# Patient Record
Sex: Male | Born: 1960 | Race: White | Hispanic: No | Smoking: Never smoker
Health system: Southern US, Community
[De-identification: ages and names within clinical notes are randomized; demographics above are authoritative.]

## PROBLEM LIST (undated history)

## (undated) DIAGNOSIS — I10 Essential (primary) hypertension: Secondary | ICD-10-CM

## (undated) HISTORY — DX: Essential (primary) hypertension: I10

---

## 1995-09-22 HISTORY — PX: FOOT FRACTURE SURGERY: SHX645

## 2004-08-25 ENCOUNTER — Ambulatory Visit: Payer: Self-pay | Admitting: Internal Medicine

## 2005-01-16 ENCOUNTER — Ambulatory Visit: Payer: Self-pay | Admitting: Internal Medicine

## 2005-11-02 ENCOUNTER — Ambulatory Visit: Payer: Self-pay | Admitting: Internal Medicine

## 2006-01-29 ENCOUNTER — Ambulatory Visit: Payer: Self-pay | Admitting: Internal Medicine

## 2006-03-13 ENCOUNTER — Emergency Department (HOSPITAL_COMMUNITY): Admission: EM | Admit: 2006-03-13 | Discharge: 2006-03-13 | Payer: Self-pay | Admitting: Family Medicine

## 2006-03-26 ENCOUNTER — Ambulatory Visit: Payer: Self-pay | Admitting: Family Medicine

## 2006-04-21 ENCOUNTER — Ambulatory Visit: Payer: Self-pay | Admitting: Internal Medicine

## 2006-05-07 ENCOUNTER — Ambulatory Visit: Payer: Self-pay | Admitting: Internal Medicine

## 2006-07-23 ENCOUNTER — Ambulatory Visit: Payer: Self-pay | Admitting: Internal Medicine

## 2006-07-28 ENCOUNTER — Encounter: Payer: Self-pay | Admitting: Internal Medicine

## 2006-07-28 ENCOUNTER — Encounter: Admission: RE | Admit: 2006-07-28 | Discharge: 2006-07-28 | Payer: Self-pay | Admitting: Internal Medicine

## 2006-09-23 ENCOUNTER — Encounter: Payer: Self-pay | Admitting: Internal Medicine

## 2006-12-20 ENCOUNTER — Encounter: Payer: Self-pay | Admitting: Internal Medicine

## 2006-12-20 ENCOUNTER — Ambulatory Visit: Payer: Self-pay | Admitting: Internal Medicine

## 2006-12-20 DIAGNOSIS — M109 Gout, unspecified: Secondary | ICD-10-CM

## 2006-12-20 DIAGNOSIS — M722 Plantar fascial fibromatosis: Secondary | ICD-10-CM | POA: Insufficient documentation

## 2006-12-20 DIAGNOSIS — I10 Essential (primary) hypertension: Secondary | ICD-10-CM

## 2007-05-19 IMAGING — CR DG FOOT COMPLETE 3+V*R*
2 series · 2 of 2 positions shown · non-contrast
Comparison: none

HISTORY: Pain, fall

RIGHT FOOT 3 VIEWS:
Mild soft tissue swelling medial to first metatarsal.
No fracture, dislocation, or bone destruction.
Mineralization normal and joint spaces preserved.

[view not recorded (1 of 2)]
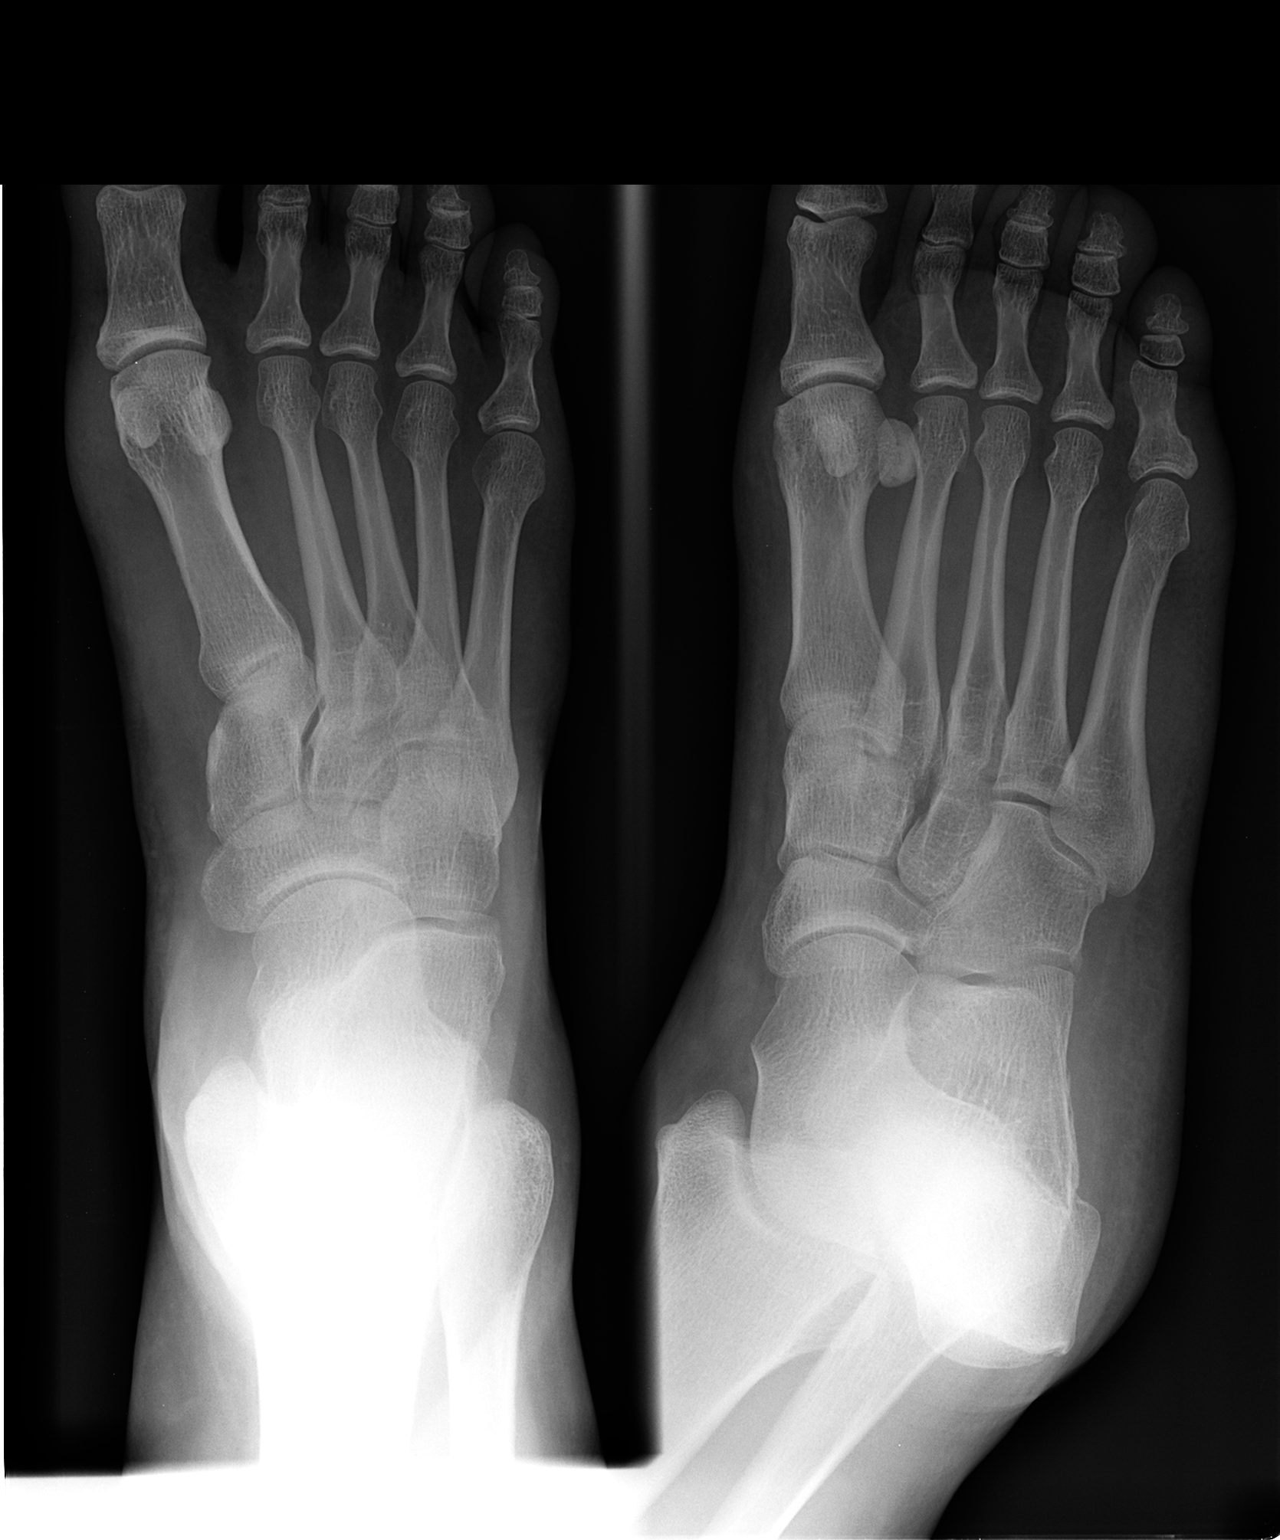

[view not recorded (2 of 2)]
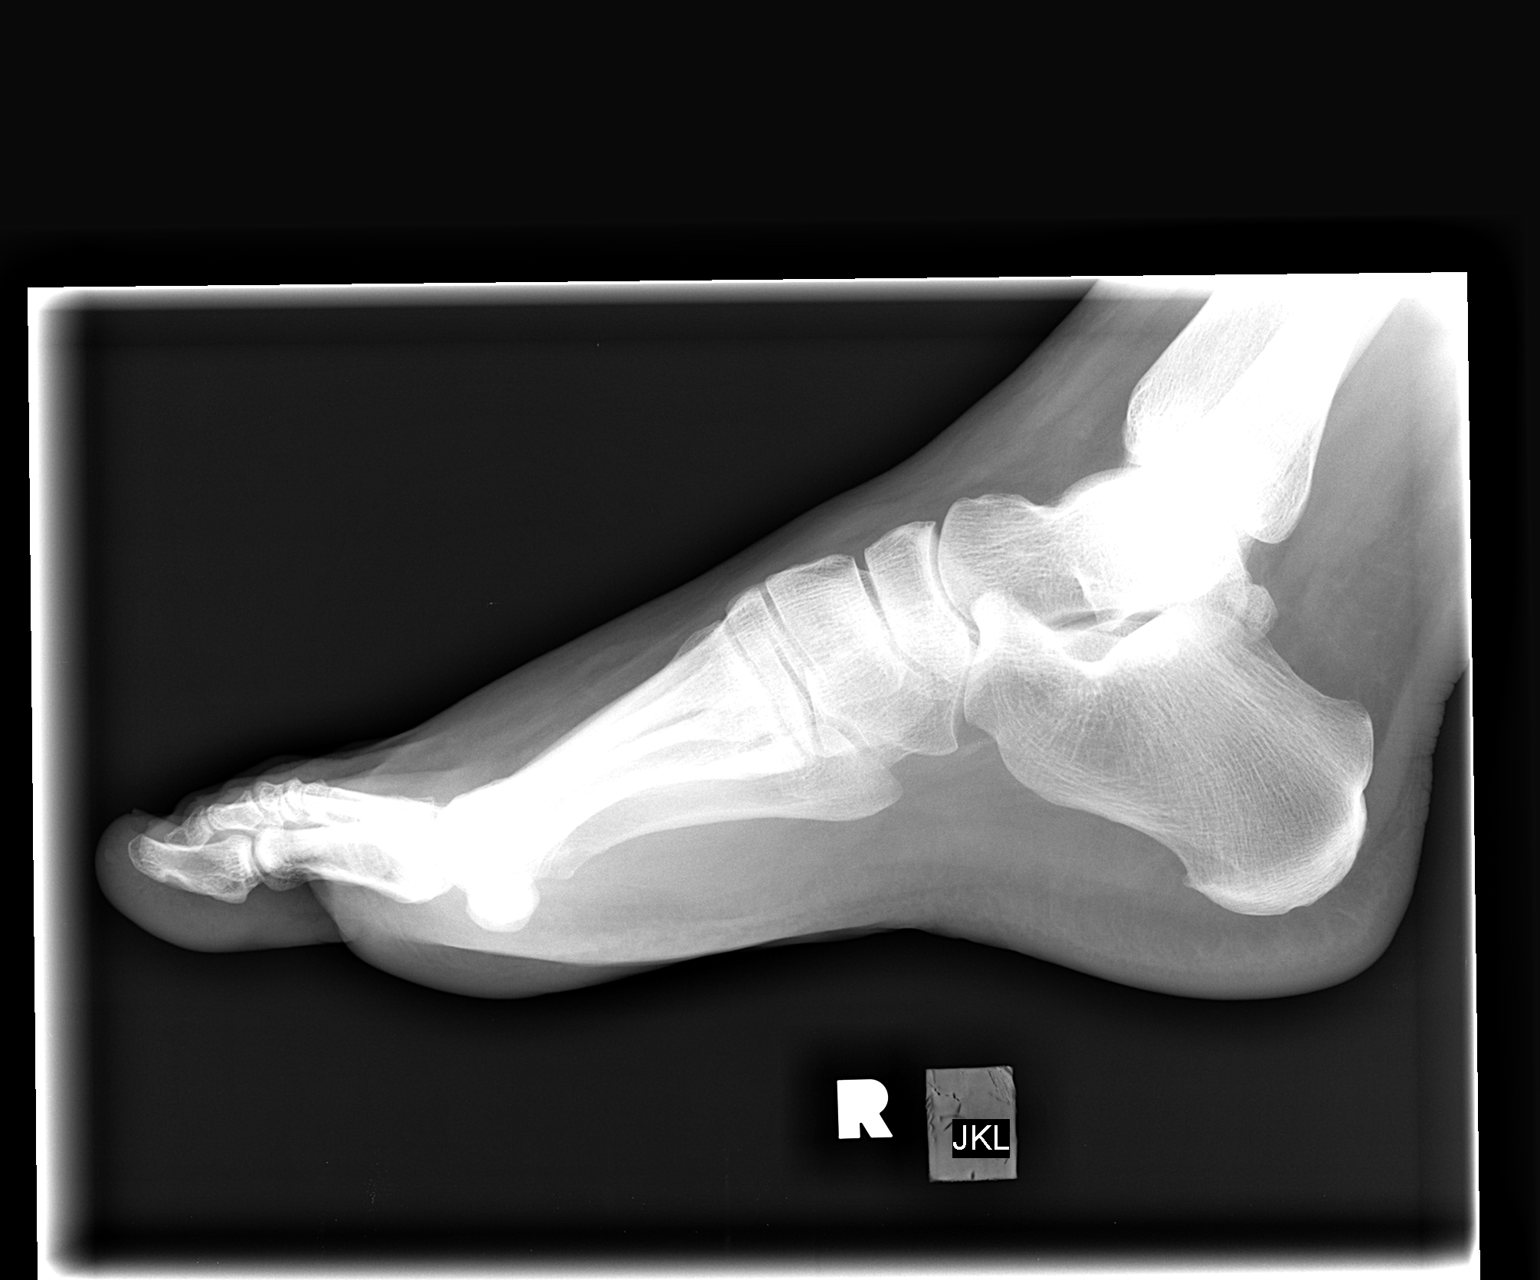

[2 of 2 positions shown; findings below may reference images not displayed]

IMPRESSION: No acute bony abnormalities.

## 2007-09-22 HISTORY — PX: ENDOVENOUS ABLATION SAPHENOUS VEIN W/ LASER: SUR449

## 2008-03-04 ENCOUNTER — Emergency Department (HOSPITAL_COMMUNITY): Admission: EM | Admit: 2008-03-04 | Discharge: 2008-03-04 | Payer: Self-pay | Admitting: Family Medicine

## 2008-04-27 ENCOUNTER — Ambulatory Visit: Payer: Self-pay | Admitting: Family Medicine

## 2008-04-27 DIAGNOSIS — L301 Dyshidrosis [pompholyx]: Secondary | ICD-10-CM

## 2008-04-27 LAB — CONVERTED CEMR LAB: KOH Prep: NEGATIVE

## 2009-02-20 ENCOUNTER — Ambulatory Visit: Payer: Self-pay | Admitting: Internal Medicine

## 2009-02-20 DIAGNOSIS — R42 Dizziness and giddiness: Secondary | ICD-10-CM

## 2009-02-20 DIAGNOSIS — R5381 Other malaise: Secondary | ICD-10-CM

## 2009-02-20 DIAGNOSIS — R5383 Other fatigue: Secondary | ICD-10-CM

## 2009-05-10 IMAGING — CR DG FINGER THUMB 2+V*R*
1 series · 1 of 1 positions shown · non-contrast
Comparison: None

CLINICAL DATA: Thumb trauma and pain.

RIGHT THUMB 2+V

[view not recorded]
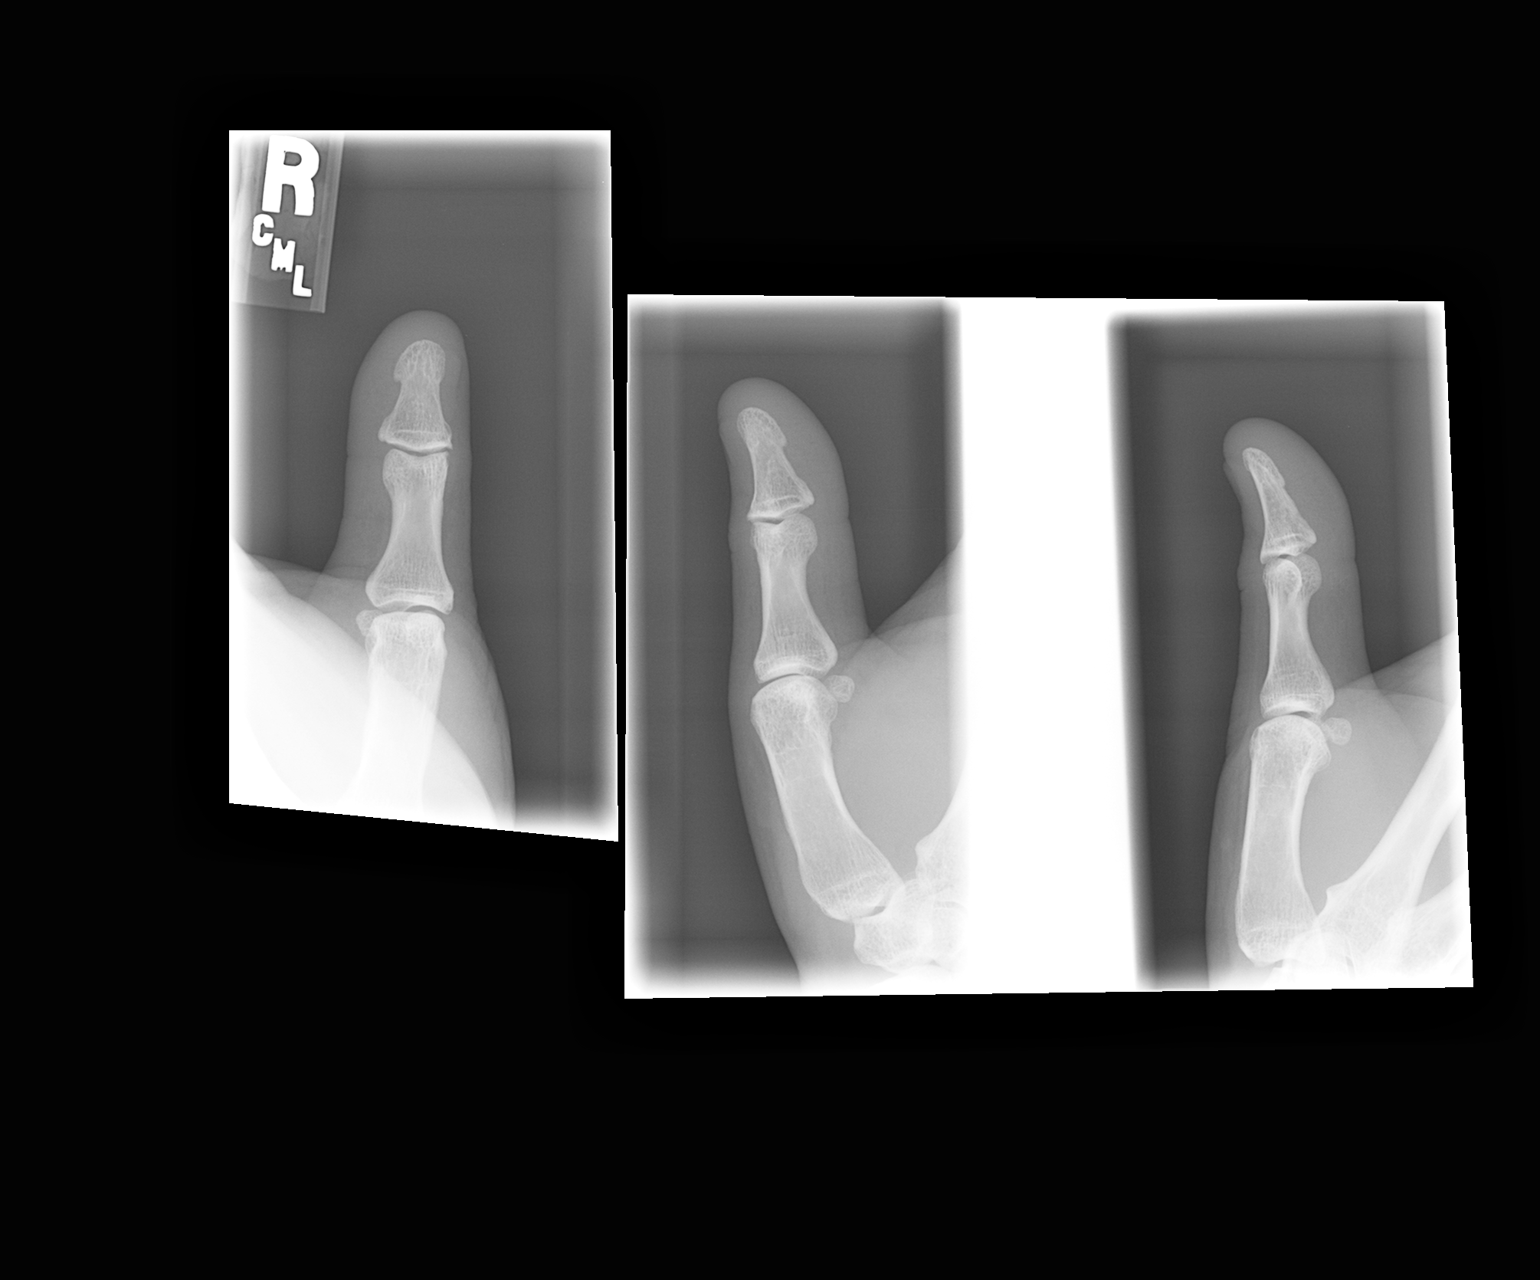

[1 of 1 positions shown; findings below may reference images not displayed]

There is no evidence of fracture or dislocation.  There is no
evidence of arthropathy or other focal bone abnormality.  Soft
tissues are unremarkable.
IMPRESSION: Negative.

## 2009-05-30 ENCOUNTER — Ambulatory Visit: Payer: Self-pay | Admitting: Internal Medicine

## 2009-05-31 LAB — CONVERTED CEMR LAB
Alkaline Phosphatase: 58 units/L (ref 39–117)
Basophils Absolute: 0 10*3/uL (ref 0.0–0.1)
Basophils Relative: 0.2 % (ref 0.0–3.0)
Chloride: 101 meq/L (ref 96–112)
Creatinine, Ser: 1 mg/dL (ref 0.4–1.5)
Eosinophils Absolute: 0.1 10*3/uL (ref 0.0–0.7)
Glucose, Bld: 102 mg/dL — ABNORMAL HIGH (ref 70–99)
HCT: 47.8 % (ref 39.0–52.0)
Hemoglobin: 16.8 g/dL (ref 13.0–17.0)
MCHC: 35.2 g/dL (ref 30.0–36.0)
MCV: 93.1 fL (ref 78.0–100.0)
Monocytes Absolute: 0.5 10*3/uL (ref 0.1–1.0)
Monocytes Relative: 7.7 % (ref 3.0–12.0)
Platelets: 169 10*3/uL (ref 150.0–400.0)
RBC: 5.13 M/uL (ref 4.22–5.81)
RDW: 12.4 % (ref 11.5–14.6)
TSH: 1.28 microintl units/mL (ref 0.35–5.50)
Total Bilirubin: 1 mg/dL (ref 0.3–1.2)
Total Protein: 6.9 g/dL (ref 6.0–8.3)

## 2009-07-23 ENCOUNTER — Ambulatory Visit: Payer: Self-pay | Admitting: Internal Medicine

## 2009-07-23 DIAGNOSIS — S01501A Unspecified open wound of lip, initial encounter: Secondary | ICD-10-CM | POA: Insufficient documentation

## 2010-10-19 LAB — CONVERTED CEMR LAB
AST: 33 units/L (ref 0–37)
BUN: 13 mg/dL (ref 6–23)
Basophils Absolute: 0 10*3/uL (ref 0.0–0.1)
Calcium: 9 mg/dL (ref 8.4–10.5)
Eosinophils Absolute: 0.2 10*3/uL (ref 0.0–0.7)
HCT: 48.2 % (ref 39.0–52.0)
Lymphocytes Relative: 27.2 % (ref 12.0–46.0)
MCV: 93.6 fL (ref 78.0–100.0)
Monocytes Absolute: 0.5 10*3/uL (ref 0.1–1.0)
Monocytes Relative: 8.1 % (ref 3.0–12.0)
Potassium: 4.2 meq/L (ref 3.5–5.1)
RBC: 5.15 M/uL (ref 4.22–5.81)
RDW: 12.4 % (ref 11.5–14.6)
Sed Rate: 7 mm/hr (ref 0–22)
Sodium: 142 meq/L (ref 135–145)
TSH: 2.11 microintl units/mL (ref 0.35–5.50)
Total Bilirubin: 0.9 mg/dL (ref 0.3–1.2)
WBC: 6.1 10*3/uL (ref 4.5–10.5)

## 2011-02-05 ENCOUNTER — Encounter: Payer: Self-pay | Admitting: Internal Medicine

## 2011-02-06 ENCOUNTER — Encounter: Payer: Self-pay | Admitting: Internal Medicine

## 2011-02-06 ENCOUNTER — Ambulatory Visit (INDEPENDENT_AMBULATORY_CARE_PROVIDER_SITE_OTHER): Payer: 59 | Admitting: Internal Medicine

## 2011-02-06 VITALS — BP 120/88 | HR 77 | Temp 98.4°F | Ht 68.0 in | Wt 223.0 lb

## 2011-02-06 DIAGNOSIS — Z1211 Encounter for screening for malignant neoplasm of colon: Secondary | ICD-10-CM

## 2011-02-06 DIAGNOSIS — I10 Essential (primary) hypertension: Secondary | ICD-10-CM

## 2011-02-06 DIAGNOSIS — Z0000 Encounter for general adult medical examination without abnormal findings: Secondary | ICD-10-CM

## 2011-02-06 DIAGNOSIS — M109 Gout, unspecified: Secondary | ICD-10-CM

## 2011-02-06 DIAGNOSIS — Z125 Encounter for screening for malignant neoplasm of prostate: Secondary | ICD-10-CM

## 2011-02-06 LAB — HEPATIC FUNCTION PANEL
ALT: 45 U/L (ref 0–53)
Albumin: 3.8 g/dL (ref 3.5–5.2)
Bilirubin, Direct: 0.1 mg/dL (ref 0.0–0.3)
Total Bilirubin: 0.5 mg/dL (ref 0.3–1.2)
Total Protein: 6.3 g/dL (ref 6.0–8.3)

## 2011-02-06 LAB — BASIC METABOLIC PANEL
BUN: 14 mg/dL (ref 6–23)
Calcium: 9 mg/dL (ref 8.4–10.5)
Chloride: 108 mEq/L (ref 96–112)

## 2011-02-06 LAB — PSA: PSA: 0.75 ng/mL (ref 0.10–4.00)

## 2011-02-06 LAB — CBC WITH DIFFERENTIAL/PLATELET
Basophils Absolute: 0 10*3/uL (ref 0.0–0.1)
Eosinophils Absolute: 0.2 10*3/uL (ref 0.0–0.7)
HCT: 46.4 % (ref 39.0–52.0)
MCV: 92.7 fl (ref 78.0–100.0)
Monocytes Absolute: 0.5 10*3/uL (ref 0.1–1.0)
RBC: 5 Mil/uL (ref 4.22–5.81)

## 2011-02-06 LAB — LIPID PANEL
Cholesterol: 167 mg/dL (ref 0–200)
HDL: 38.4 mg/dL — ABNORMAL LOW (ref >39.00)
Total CHOL/HDL Ratio: 4

## 2011-02-06 NOTE — Progress Notes (Signed)
Subjective:    Patient ID: Mitchell Hardy, male    DOB: 01-Aug-1961, 50 y.o.   MRN: [id number]  HPI Doing well No new problems Ran out of lisinopril and no refills so stopped med Feels fine occ edema in legs---discussed that this wouldn't help  No problems with the gout Eating much more healthy  Current outpatient prescriptions:DISCONTD: lisinopril (PRINIVIL,ZESTRIL) 2.5 MG tablet, Take 2.5 mg by mouth daily.  , Disp: , Rfl:   Past Medical History  Diagnosis Date  . Hypertension   . Gout     Past Surgical History  Procedure Date  . Foot fracture surgery 1997    has pins  . Endovenous ablation saphenous vein w/ laser 2009    right leg    Family History  Problem Relation Age of Onset  . COPD Mother     History   Social History  . Marital Status: Married    Spouse Name: N/A    Number of Children: 3  . Years of Education: N/A   Occupational History  . 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 Smoking status: Never Smoker   . Smokeless tobacco: Not on file  . Alcohol Use: Yes     occasional  . Drug Use: Not on file  . Sexually Active: Not on file   Other Topics Concern  . Not on file   Social History Narrative  . No narrative on file   Review of Systems  Constitutional: Negative for fatigue and unexpected weight change.  HENT: Positive for tinnitus. Negative for hearing loss, congestion, rhinorrhea and dental problem.        Chronic tinnitus since the service Keeps up with the dentist  Eyes: Negative for visual disturbance.       No focal vision loss or diplopia  Respiratory: Negative for cough, chest tightness and shortness of breath.   Cardiovascular: Positive for leg swelling. Negative for chest pain and palpitations.       Occ edema by the end of the day--ache at the end of the day Better since varicose vein procedure  Gastrointestinal: Negative for nausea, vomiting, abdominal pain, constipation and blood in stool.       Very rare  heartburn  Genitourinary: Negative for dysuria, urgency, frequency, decreased urine volume and difficulty urinating.       No sexual problems  Musculoskeletal: Positive for arthralgias. Negative for back pain and joint swelling.       Occ hand stiffness  Skin: Negative for rash.       No suspicious lesions  Neurological: Negative for dizziness, syncope, weakness, numbness and headaches.  Hematological: Negative for adenopathy. Does not bruise/bleed easily.  Psychiatric/Behavioral: Negative for hallucinations and sleep disturbance. The patient is not nervous/anxious.        Objective:   Physical Exam  Constitutional: He is oriented to person, place, and time. He appears well-developed and well-nourished. No distress.  HENT:  Head: Normocephalic and atraumatic.  Right Ear: External ear normal.  Left Ear: External ear normal.  Mouth/Throat: Oropharynx is clear and moist. No oropharyngeal exudate.       TMs normal  Eyes: Conjunctivae and EOM are normal. Pupils are equal, round, and reactive to light.       Fundi benign  Neck: Normal range of motion. Neck supple. No thyromegaly present.  Cardiovascular: Normal rate, regular rhythm, normal heart sounds and intact distal pulses.  Exam reveals no gallop.   No murmur heard. Pulmonary/Chest: Effort normal  and breath sounds normal. No respiratory distress. He has no wheezes. He has no rales.  Abdominal: Soft. He exhibits no mass. There is no tenderness.  Musculoskeletal: Normal range of motion. He exhibits no edema and no tenderness.  Lymphadenopathy:    He has no cervical adenopathy.  Neurological: He is alert and oriented to person, place, and time. He exhibits normal muscle tone.  Skin: Skin is warm. No rash noted.       No suspicious lesions  Psychiatric: He has a normal mood and affect. His behavior is normal. Judgment and thought content normal.          Assessment & Plan:

## 2011-03-26 ENCOUNTER — Ambulatory Visit (AMBULATORY_SURGERY_CENTER): Payer: 59 | Admitting: *Deleted

## 2011-03-26 VITALS — Ht 68.0 in | Wt 223.0 lb

## 2011-03-26 DIAGNOSIS — Z1211 Encounter for screening for malignant neoplasm of colon: Secondary | ICD-10-CM

## 2011-03-26 MED ORDER — PEG-KCL-NACL-NASULF-NA ASC-C 100 G PO SOLR: ORAL | Status: DC

## 2011-04-09 ENCOUNTER — Ambulatory Visit (AMBULATORY_SURGERY_CENTER): Payer: 59 | Admitting: Internal Medicine

## 2011-04-09 ENCOUNTER — Encounter: Payer: Self-pay | Admitting: Internal Medicine

## 2011-04-09 VITALS — BP 136/99 | HR 69 | Temp 96.9°F | Resp 18 | Ht 68.0 in | Wt 218.0 lb

## 2011-04-09 DIAGNOSIS — D126 Benign neoplasm of colon, unspecified: Secondary | ICD-10-CM

## 2011-04-09 DIAGNOSIS — D128 Benign neoplasm of rectum: Secondary | ICD-10-CM

## 2011-04-09 DIAGNOSIS — D129 Benign neoplasm of anus and anal canal: Secondary | ICD-10-CM

## 2011-04-09 DIAGNOSIS — Z1211 Encounter for screening for malignant neoplasm of colon: Secondary | ICD-10-CM

## 2011-04-09 MED ORDER — SODIUM CHLORIDE 0.9 % IV SOLN: 500.0000 mL | INTRAVENOUS | Status: DC

## 2011-04-09 NOTE — Patient Instructions (Signed)
Please read the handouts given to you by your recovery room nurse.   We will send you a letter within 2 weeks regarding the results of your biopsies.   Please increase the fiber in your diet due to your diverticulosis.     Resume your routine medications today.   If you have any questions or concerns, please call us at [phone].  Thank-you.

## 2011-04-10 ENCOUNTER — Telehealth: Payer: Self-pay | Admitting: *Deleted

## 2011-04-10 NOTE — Telephone Encounter (Signed)

## 2011-04-14 ENCOUNTER — Encounter: Payer: Self-pay | Admitting: Internal Medicine

## 2011-04-20 ENCOUNTER — Telehealth: Payer: Self-pay | Admitting: *Deleted

## 2011-04-20 NOTE — Telephone Encounter (Signed)
Pt has recently had a colonoscopy and is asking that you call him to discuss the results.  I advised him that you are out until 8/6.

## 2011-04-27 NOTE — Telephone Encounter (Signed)
Had 2 small polyps (tubular adenoma) and very early diverticulosis No answer on his phones---will try back

## 2011-04-28 NOTE — Telephone Encounter (Signed)
Message left again.

## 2011-04-29 NOTE — Telephone Encounter (Signed)
Discussed results Repeat colonoscopy recommended in 5 years No changes needed with the tiny diverticuli

## 2012-02-09 ENCOUNTER — Encounter: Payer: Self-pay | Admitting: Internal Medicine

## 2012-02-09 ENCOUNTER — Ambulatory Visit (INDEPENDENT_AMBULATORY_CARE_PROVIDER_SITE_OTHER): Payer: 59 | Admitting: Internal Medicine

## 2012-02-09 VITALS — BP 122/88 | HR 81 | Temp 97.7°F | Ht 68.0 in | Wt 219.0 lb

## 2012-02-09 DIAGNOSIS — G479 Sleep disorder, unspecified: Secondary | ICD-10-CM

## 2012-02-09 DIAGNOSIS — G478 Other sleep disorders: Secondary | ICD-10-CM

## 2012-02-09 DIAGNOSIS — I10 Essential (primary) hypertension: Secondary | ICD-10-CM

## 2012-02-09 DIAGNOSIS — Z0000 Encounter for general adult medical examination without abnormal findings: Secondary | ICD-10-CM

## 2012-02-09 LAB — PSA: PSA: 0.8 ng/mL (ref 0.10–4.00)

## 2012-02-09 NOTE — Assessment & Plan Note (Signed)
Healthy but still could work more on fitness Will check sugar and PSA

## 2012-02-09 NOTE — Progress Notes (Signed)
Subjective:    Patient ID: Mitchell Hardy, male    DOB: 1961-02-08, 51 y.o.   MRN: [id number]  HPI Doing well Business is good  Has had some arguments with wife---thinks she may be in "mid-life crisis" Disagreements about how she treats her oldest--a daughter back from college but now at [facility] and living with them Discussed pursuing counseling with her  Had hemorrhoid about 3 weeks ago Used sitz baths and tuck pads Settled down  Gout is quiet  No current outpatient prescriptions on file prior to visit.   Current Facility-Administered Medications on File Prior to Visit  Medication Dose Route Frequency Provider Last Rate Last Dose  . DISCONTD: 0.9 %  sodium chloride infusion  500 mL Intravenous Continuous Hart Carwin, MD        Allergies  Allergen Reactions  . Atarax (Hydroxyzine Hcl) Anaphylaxis and Rash    Past Medical History  Diagnosis Date  . Hypertension   . Gout     Past Surgical History  Procedure Date  . Foot fracture surgery 1997    has pins  . Endovenous ablation saphenous vein w/ laser 2009    right leg    Family History  Problem Relation Age of Onset  . COPD Mother     History   Social History  . Marital Status: Married    Spouse Name: N/A    Number of Children: 3  . Years of Education: N/A   Occupational History  . 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 Smoking status: Never Smoker   . Smokeless tobacco: Never Used  . Alcohol Use: Yes     occasional  . Drug Use: No  . Sexually Active: Not on file   Other Topics Concern  . Not on file   Social History Narrative  . No narrative on file   Review of Systems  Constitutional: Negative for fatigue and unexpected weight change.       Wears seat belt  HENT: Negative for hearing loss, congestion, rhinorrhea, dental problem and tinnitus.        Regular with dentist  Eyes: Negative for visual disturbance.       No diplopia or unilateral vision loss Some astigmatism in right  eye---uses readers  Respiratory: Negative for cough, chest tightness and shortness of breath.   Cardiovascular: Positive for leg swelling. Negative for chest pain and palpitations.       Mild ankle edema at the end of the day  Gastrointestinal: Negative for nausea, vomiting, abdominal pain, constipation and blood in stool.       No heartburn  Genitourinary: Negative for frequency and difficulty urinating.       No sexual problems  Musculoskeletal: Negative for back pain, joint swelling and arthralgias.  Skin: Negative for color change and rash.  Neurological: Positive for numbness. Negative for dizziness, syncope, weakness, light-headedness and headaches.  Hematological: Negative for adenopathy. Does not bruise/bleed easily.  Psychiatric/Behavioral: Positive for sleep disturbance. Negative for dysphoric mood. The patient is not nervous/anxious.        Not a great sleeper---unusual work schedule Wife wonders about sleep apnea----feels he has poor sleep quality       Objective:   Physical Exam  Constitutional: He is oriented to person, place, and time. He appears well-developed and well-nourished. No distress.  HENT:  Head: Normocephalic and atraumatic.  Right Ear: External ear normal.  Left Ear: External ear normal.  Mouth/Throat: Oropharynx is clear and moist.  No oropharyngeal exudate.  Eyes: Conjunctivae and EOM are normal. Pupils are equal, round, and reactive to light.  Neck: Normal range of motion. Neck supple. No thyromegaly present.  Cardiovascular: Normal rate, regular rhythm, normal heart sounds and intact distal pulses.  Exam reveals no gallop.   No murmur heard. Pulmonary/Chest: Effort normal and breath sounds normal. No respiratory distress. He has no wheezes. He has no rales.  Abdominal: Soft. There is no tenderness.  Musculoskeletal: Normal range of motion. He exhibits no edema and no tenderness.  Lymphadenopathy:    He has no cervical adenopathy.  Neurological: He is  alert and oriented to person, place, and time.  Skin: No rash noted. No erythema.  Psychiatric: He has a normal mood and affect. His behavior is normal. Thought content normal.          Assessment & Plan:

## 2012-02-09 NOTE — Assessment & Plan Note (Signed)
Non restorative sleep and daytime sleepiness Will set up evaluation

## 2012-02-09 NOTE — Assessment & Plan Note (Signed)
BP Readings from Last 3 Encounters:  02/09/12 122/88  04/09/11 136/99  02/06/11 120/88   Has good control without meds

## 2012-02-11 ENCOUNTER — Encounter: Payer: Self-pay | Admitting: *Deleted

## 2012-03-04 ENCOUNTER — Institutional Professional Consult (permissible substitution): Payer: 59 | Admitting: Pulmonary Disease

## 2012-03-11 ENCOUNTER — Institutional Professional Consult (permissible substitution): Payer: 59 | Admitting: Pulmonary Disease

## 2013-02-15 ENCOUNTER — Encounter: Payer: 59 | Admitting: Internal Medicine

## 2013-02-15 DIAGNOSIS — Z0289 Encounter for other administrative examinations: Secondary | ICD-10-CM

## 2016-02-24 ENCOUNTER — Encounter: Payer: Self-pay | Admitting: Gastroenterology

## 2018-10-26 ENCOUNTER — Ambulatory Visit: Payer: BLUE CROSS/BLUE SHIELD | Admitting: Internal Medicine

## 2018-10-26 ENCOUNTER — Encounter: Payer: Self-pay | Admitting: Internal Medicine

## 2018-10-26 ENCOUNTER — Encounter (INDEPENDENT_AMBULATORY_CARE_PROVIDER_SITE_OTHER): Payer: Self-pay

## 2018-10-26 VITALS — BP 130/88 | HR 82 | Temp 98.2°F | Ht 67.0 in | Wt 254.0 lb

## 2018-10-26 DIAGNOSIS — D126 Benign neoplasm of colon, unspecified: Secondary | ICD-10-CM | POA: Diagnosis not present

## 2018-10-26 DIAGNOSIS — Z0000 Encounter for general adult medical examination without abnormal findings: Secondary | ICD-10-CM

## 2018-10-26 DIAGNOSIS — Z0001 Encounter for general adult medical examination with abnormal findings: Secondary | ICD-10-CM

## 2018-10-26 DIAGNOSIS — M1A9XX0 Chronic gout, unspecified, without tophus (tophi): Secondary | ICD-10-CM

## 2018-10-26 DIAGNOSIS — I10 Essential (primary) hypertension: Secondary | ICD-10-CM | POA: Diagnosis not present

## 2018-10-26 DIAGNOSIS — L570 Actinic keratosis: Secondary | ICD-10-CM

## 2018-10-26 LAB — COMPREHENSIVE METABOLIC PANEL
ALBUMIN: 4 g/dL (ref 3.5–5.2)
ALK PHOS: 57 U/L (ref 39–117)
ALT: 49 U/L (ref 0–53)
AST: 41 U/L — ABNORMAL HIGH (ref 0–37)
BILIRUBIN TOTAL: 0.4 mg/dL (ref 0.2–1.2)
BUN: 13 mg/dL (ref 6–23)
CALCIUM: 8.5 mg/dL (ref 8.4–10.5)
CO2: 26 mEq/L (ref 19–32)
CREATININE: 1.05 mg/dL (ref 0.40–1.50)
Chloride: 103 mEq/L (ref 96–112)
GFR: 72.45 mL/min (ref >60.00)
Glucose, Bld: 87 mg/dL (ref 70–99)
Potassium: 3.8 mEq/L (ref 3.5–5.1)
Sodium: 138 mEq/L (ref 135–145)
TOTAL PROTEIN: 6.8 g/dL (ref 6.0–8.3)

## 2018-10-26 LAB — CBC
HEMATOCRIT: 49.3 % (ref 39.0–52.0)
Hemoglobin: 17.1 g/dL — ABNORMAL HIGH (ref 13.0–17.0)
MCHC: 34.7 g/dL (ref 30.0–36.0)
MCV: 89.6 fl (ref 78.0–100.0)
PLATELETS: 155 10*3/uL (ref 150.0–400.0)
RBC: 5.5 Mil/uL (ref 4.22–5.81)
RDW: 13.7 % (ref 11.5–15.5)
WBC: 3.7 10*3/uL — AB (ref 4.0–10.5)

## 2018-10-26 LAB — LIPID PANEL
CHOLESTEROL: 117 mg/dL (ref 0–200)
HDL: 20.5 mg/dL — ABNORMAL LOW (ref >39.00)
LDL Cholesterol: 71 mg/dL (ref 0–99)
NonHDL: 96.49
TRIGLYCERIDES: 127 mg/dL (ref 0.0–149.0)
Total CHOL/HDL Ratio: 6
VLDL: 25.4 mg/dL (ref 0.0–40.0)

## 2018-10-26 LAB — URIC ACID: Uric Acid, Serum: 8 mg/dL — ABNORMAL HIGH (ref 4.0–7.8)

## 2018-10-26 LAB — T4, FREE: Free T4: 1.06 ng/dL (ref 0.60–1.60)

## 2018-10-26 MED ORDER — TRIAMTERENE-HCTZ 37.5-25 MG PO TABS: 1.0000 | ORAL_TABLET | Freq: Every day | ORAL | 3 refills | Status: DC

## 2018-10-26 NOTE — Assessment & Plan Note (Signed)
Healthy but has let himself go Overdue for colonoscopy Discussed PSA--will check Discussed DASH and exercise

## 2018-10-26 NOTE — Assessment & Plan Note (Signed)
BP Readings from Last 3 Encounters:  10/26/18 130/88  02/09/12 122/88  04/09/11 (!) 136/99   As high as 160/110 at home And fluid retention Will start HCTZ

## 2018-10-26 NOTE — Progress Notes (Signed)
Subjective:    Patient ID: Mitchell Hardy, male    DOB: 1961/02/03, 58 y.o.   MRN: [id number]  HPI Here to reestablish Moved to [place] briefly --but back here for some years Hasn't seen any doctors  Has a spot on left temple Present for 6 months Tried topical antibiotics --heals with crusting, then comes back  Has been off BP meds It is creeping back up Notes his weight creeping up Fluid problems with his feet ---swelling in the evening Less active at work [employer] he eats fairly healthy  Past gout---knows now it is related to hard cheeses  No current outpatient medications on file prior to visit.   No current facility-administered medications on file prior to visit.     Allergies  Allergen Reactions  . Atarax [Hydroxyzine Hcl] Anaphylaxis and Rash    Past Medical History:  Diagnosis Date  . Gout   . Hypertension     Past Surgical History:  Procedure Laterality Date  . ENDOVENOUS ABLATION SAPHENOUS VEIN W/ LASER  2009   right leg  . [place]   has pins    Family History  Problem Relation Age of Onset  . COPD Mother   . Head & neck cancer Father     Social History   Socioeconomic History  . Marital status: Divorced    Spouse name: Not on file  . Number of children: 3  . Years of education: Not on file  . Highest education level: Not on file  Occupational History  . Occupation: [employer]     Comment: [employer]  Social Needs  . Financial resource strain: Not on file  . Food insecurity:    Worry: Not on file    Inability: Not on file  . Transportation needs:    Medical: Not on file    Non-medical: Not on file  Tobacco Use  . Smoking status: Never Smoker  . Smokeless tobacco: Never Used  Substance and Sexual Activity  . Alcohol use: Yes    Comment: occasional  . Drug use: No  . Sexual activity: Not on file  Lifestyle  . Physical activity:    Days per week: Not on file    Minutes per session: Not on file  .  Stress: Not on file  Relationships  . Social connections:    Talks on phone: Not on file    Gets together: Not on file    Attends religious service: Not on file    Active member of club or organization: Not on file    Attends meetings of clubs or organizations: Not on file    Relationship status: Not on file  . Intimate partner violence:    Fear of current or ex partner: Not on file    Emotionally abused: Not on file    Physically abused: Not on file    Forced sexual activity: Not on file  Other Topics Concern  . Not on file  Social History Narrative   Divorced ~2010   Step son and daughter and he has child from earlier relationship   Now living with woman since ~2017   She has grown children also   Review of Systems  Constitutional: Negative for fatigue.       Has gained 35# since last visit Wears seat belt  HENT: Positive for tinnitus. Negative for dental problem and hearing loss.        Keeps up with dentist  Eyes: Negative for  visual disturbance.       No diplopia or unilateral vision loss  Respiratory:       Cough with recent URI DOE as expected  Cardiovascular: Positive for leg swelling. Negative for chest pain and palpitations.  Gastrointestinal: Negative for blood in stool and constipation.       No heartburn  Endocrine: Negative for polydipsia and polyuria.  Genitourinary: Negative for difficulty urinating and urgency.       Nocturia x 1-2 No sexual problems  Musculoskeletal: Negative for arthralgias, back pain and joint swelling.  Skin: Negative for rash.  Allergic/Immunologic: Positive for environmental allergies. Negative for immunocompromised state.       No meds   Neurological: Negative for dizziness, syncope, light-headedness and headaches.  Hematological: Negative for adenopathy. Does not bruise/bleed easily.  Psychiatric/Behavioral: Negative for dysphoric mood. The patient is not nervous/anxious.        Chronic sleep problems--- discussed trying  melatonin       Objective:   Physical Exam  Constitutional: He is oriented to person, place, and time. He appears well-developed. No distress.  HENT:  Head: Normocephalic and atraumatic.  Right Ear: External ear normal.  Left Ear: External ear normal.  Mouth/Throat: Oropharynx is clear and moist. No oropharyngeal exudate.  Eyes: Pupils are equal, round, and reactive to light. Conjunctivae are normal.  Neck: No thyromegaly present.  Cardiovascular: Normal rate, regular rhythm, normal heart sounds and intact distal pulses. Exam reveals no gallop.  No murmur heard. Respiratory: Effort normal and breath sounds normal. No respiratory distress. He has no wheezes. He has no rales.  GI: Soft. There is no abdominal tenderness.  Musculoskeletal:     Comments: Calves/feet full without pitting  Lymphadenopathy:    He has no cervical adenopathy.  Neurological: He is alert and oriented to person, place, and time.  Skin:  23mm actinic left temple  Psychiatric: He has a normal mood and affect. His behavior is normal.           Assessment & Plan:

## 2018-10-26 NOTE — Assessment & Plan Note (Signed)
Brought on by hard cheeses Will have to stop HCTZ if flares occur

## 2018-10-26 NOTE — Assessment & Plan Note (Signed)
Discussed options Verbal consent Liquid nitrogen cryotherapy 30 seconds x 2 Tolerated well Discussed home care Derm if recurs

## 2018-10-26 NOTE — Patient Instructions (Signed)
DASH Eating Plan  DASH stands for "Dietary Approaches to Stop Hypertension." The DASH eating plan is a healthy eating plan that has been shown to reduce high blood pressure (hypertension). It may also reduce your risk for type 2 diabetes, heart disease, and stroke. The DASH eating plan may also help with weight loss.  What are tips for following this plan?    General guidelines   Avoid eating more than 2,300 mg (milligrams) of salt (sodium) a day. If you have hypertension, you may need to reduce your sodium intake to 1,500 mg a day.   Limit alcohol intake to no more than 1 drink a day for nonpregnant women and 2 drinks a day for men. One drink equals 12 oz of beer, 5 oz of wine, or 1 oz of hard liquor.   Work with your health care provider to maintain a healthy body weight or to lose weight. Ask what an ideal weight is for you.   Get at least 30 minutes of exercise that causes your heart to beat faster (aerobic exercise) most days of the week. Activities may include walking, swimming, or biking.   Work with your health care provider or diet and nutrition specialist (dietitian) to adjust your eating plan to your individual calorie needs.  Reading food labels     Check food labels for the amount of sodium per serving. Choose foods with less than 5 percent of the Daily Value of sodium. Generally, foods with less than 300 mg of sodium per serving fit into this eating plan.   To find whole grains, look for the word "whole" as the first word in the ingredient list.  Shopping   Buy products labeled as "low-sodium" or "no salt added."   Buy fresh foods. Avoid canned foods and premade or frozen meals.  Cooking   Avoid adding salt when cooking. Use salt-free seasonings or herbs instead of table salt or sea salt. Check with your health care provider or pharmacist before using salt substitutes.   Do not fry foods. Cook foods using healthy methods such as baking, boiling, grilling, and broiling instead.   Cook with  heart-healthy oils, such as olive, canola, soybean, or sunflower oil.  Meal planning   Eat a balanced diet that includes:  ? 5 or more servings of fruits and vegetables each day. At each meal, try to fill half of your plate with fruits and vegetables.  ? Up to 6-8 servings of whole grains each day.  ? Less than 6 oz of lean meat, poultry, or fish each day. A 3-oz serving of meat is about the same size as a deck of cards. One egg equals 1 oz.  ? 2 servings of low-fat dairy each day.  ? A serving of nuts, seeds, or beans 5 times each week.  ? Heart-healthy fats. Healthy fats called Omega-3 fatty acids are found in foods such as flaxseeds and coldwater fish, like sardines, salmon, and mackerel.   Limit how much you eat of the following:  ? Canned or prepackaged foods.  ? Food that is high in trans fat, such as fried foods.  ? Food that is high in saturated fat, such as fatty meat.  ? Sweets, desserts, sugary drinks, and other foods with added sugar.  ? Full-fat dairy products.   Do not salt foods before eating.   Try to eat at least 2 vegetarian meals each week.   Eat more home-cooked food and less restaurant, buffet, and fast food.     When eating at a restaurant, ask that your food be prepared with less salt or no salt, if possible.  What foods are recommended?  The items listed may not be a complete list. Talk with your dietitian about what dietary choices are best for you.  Grains  Whole-grain or whole-wheat bread. Whole-grain or whole-wheat pasta. Brown rice. Oatmeal. Quinoa. Bulgur. Whole-grain and low-sodium cereals. Pita bread. Low-fat, low-sodium crackers. Whole-wheat flour tortillas.  Vegetables  Fresh or frozen vegetables (raw, steamed, roasted, or grilled). Low-sodium or reduced-sodium tomato and vegetable juice. Low-sodium or reduced-sodium tomato sauce and tomato paste. Low-sodium or reduced-sodium canned vegetables.  Fruits  All fresh, dried, or frozen fruit. Canned fruit in natural juice (without  added sugar).  Meat and other protein foods  Skinless chicken or turkey. Ground chicken or turkey. Pork with fat trimmed off. Fish and seafood. Egg whites. Dried beans, peas, or lentils. Unsalted nuts, nut butters, and seeds. Unsalted canned beans. Lean cuts of beef with fat trimmed off. Low-sodium, lean deli meat.  Dairy  Low-fat (1%) or fat-free (skim) milk. Fat-free, low-fat, or reduced-fat cheeses. Nonfat, low-sodium ricotta or cottage cheese. Low-fat or nonfat yogurt. Low-fat, low-sodium cheese.  Fats and oils  Soft margarine without trans fats. Vegetable oil. Low-fat, reduced-fat, or light mayonnaise and salad dressings (reduced-sodium). Canola, safflower, olive, soybean, and sunflower oils. Avocado.  Seasoning and other foods  Herbs. Spices. Seasoning mixes without salt. Unsalted popcorn and pretzels. Fat-free sweets.  What foods are not recommended?  The items listed may not be a complete list. Talk with your dietitian about what dietary choices are best for you.  Grains  Baked goods made with fat, such as croissants, muffins, or some breads. Dry pasta or rice meal packs.  Vegetables  Creamed or fried vegetables. Vegetables in a cheese sauce. Regular canned vegetables (not low-sodium or reduced-sodium). Regular canned tomato sauce and paste (not low-sodium or reduced-sodium). Regular tomato and vegetable juice (not low-sodium or reduced-sodium). Pickles. Olives.  Fruits  Canned fruit in a light or heavy syrup. Fried fruit. Fruit in cream or butter sauce.  Meat and other protein foods  Fatty cuts of meat. Ribs. Fried meat. Bacon. Sausage. Bologna and other processed lunch meats. Salami. Fatback. Hotdogs. Bratwurst. Salted nuts and seeds. Canned beans with added salt. Canned or smoked fish. Whole eggs or egg yolks. Chicken or turkey with skin.  Dairy  Whole or 2% milk, cream, and half-and-half. Whole or full-fat cream cheese. Whole-fat or sweetened yogurt. Full-fat cheese. Nondairy creamers. Whipped toppings.  Processed cheese and cheese spreads.  Fats and oils  Butter. Stick margarine. Lard. Shortening. Ghee. Bacon fat. Tropical oils, such as coconut, palm kernel, or palm oil.  Seasoning and other foods  Salted popcorn and pretzels. Onion salt, garlic salt, seasoned salt, table salt, and sea salt. Worcestershire sauce. Tartar sauce. Barbecue sauce. Teriyaki sauce. Soy sauce, including reduced-sodium. Steak sauce. Canned and packaged gravies. Fish sauce. Oyster sauce. Cocktail sauce. Horseradish that you find on the shelf. Ketchup. Mustard. Meat flavorings and tenderizers. Bouillon cubes. Hot sauce and Tabasco sauce. Premade or packaged marinades. Premade or packaged taco seasonings. Relishes. Regular salad dressings.  Where to find more information:   National Heart, Lung, and Blood Institute: www.nhlbi.nih.gov   American Heart Association: www.heart.org  Summary   The DASH eating plan is a healthy eating plan that has been shown to reduce high blood pressure (hypertension). It may also reduce your risk for type 2 diabetes, heart disease, and stroke.   With the   DASH eating plan, you should limit salt (sodium) intake to 2,300 mg a day. If you have hypertension, you may need to reduce your sodium intake to 1,500 mg a day.   When on the DASH eating plan, aim to eat more fresh fruits and vegetables, whole grains, lean proteins, low-fat dairy, and heart-healthy fats.   Work with your health care provider or diet and nutrition specialist (dietitian) to adjust your eating plan to your individual calorie needs.  This information is not intended to replace advice given to you by your health care provider. Make sure you discuss any questions you have with your health care provider.  Document Released: 08/27/2011 Document Revised: 08/31/2016 Document Reviewed: 08/31/2016  Elsevier Interactive Patient Education  2019 [employer]

## 2018-12-09 ENCOUNTER — Encounter: Payer: Self-pay | Admitting: Internal Medicine

## 2018-12-09 ENCOUNTER — Other Ambulatory Visit: Payer: Self-pay

## 2018-12-09 ENCOUNTER — Ambulatory Visit: Payer: BLUE CROSS/BLUE SHIELD | Admitting: Internal Medicine

## 2018-12-09 VITALS — BP 130/90 | HR 87 | Temp 98.1°F | Ht 67.0 in | Wt 256.0 lb

## 2018-12-09 DIAGNOSIS — I10 Essential (primary) hypertension: Secondary | ICD-10-CM

## 2018-12-09 LAB — RENAL FUNCTION PANEL
Albumin: 4 g/dL (ref 3.5–5.2)
BUN: 17 mg/dL (ref 6–23)
CO2: 30 mEq/L (ref 19–32)
Calcium: 9.1 mg/dL (ref 8.4–10.5)
Chloride: 102 mEq/L (ref 96–112)
Creatinine, Ser: 1.23 mg/dL (ref 0.40–1.50)
GFR: 60.34 mL/min (ref >60.00)
Glucose, Bld: 129 mg/dL — ABNORMAL HIGH (ref 70–99)
PHOSPHORUS: 2.4 mg/dL (ref 2.3–4.6)
Potassium: 4.4 mEq/L (ref 3.5–5.1)
Sodium: 140 mEq/L (ref 135–145)

## 2018-12-09 NOTE — Assessment & Plan Note (Signed)
BP Readings from Last 3 Encounters:  12/09/18 130/90  10/26/18 130/88  02/09/12 122/88   Has had good response per his measurements Edema is improved Will check renal Change if he has gout--discussed

## 2018-12-09 NOTE — Progress Notes (Signed)
Subjective:    Patient ID: Mitchell Hardy, male    DOB: Feb 16, 1961, 58 y.o.   MRN: [id number]  HPI Here for follow up of HTN  No problems with the medication BP has improved Some swelling in legs---but clearly better Generally 132/94 at home (usually in afternoon after full day)  No dizziness No chest pain  No SOB  Current Outpatient Medications on File Prior to Visit  Medication Sig Dispense Refill  . triamterene-hydrochlorothiazide (MAXZIDE-25) 37.5-25 MG tablet Take 1 tablet by mouth daily. 90 tablet 3   No current facility-administered medications on file prior to visit.     Allergies  Allergen Reactions  . Atarax [Hydroxyzine Hcl] Anaphylaxis and Rash    Past Medical History:  Diagnosis Date  . Gout   . Hypertension     Past Surgical History:  Procedure Laterality Date  . ENDOVENOUS ABLATION SAPHENOUS VEIN W/ LASER  2009   right leg  . [place]   has pins    Family History  Problem Relation Age of Onset  . COPD Mother   . Head & neck cancer Father     Social History   Socioeconomic History  . Marital status: Divorced    Spouse name: Not on file  . Number of children: 3  . Years of education: Not on file  . Highest education level: Not on file  Occupational History  . Occupation: Coll[employer]     Comment: [employer]  Social Needs  . Financial resource strain: Not on file  . Food insecurity:    Worry: Not on file    Inability: Not on file  . Transportation needs:    Medical: Not on file    Non-medical: Not on file  Tobacco Use  . Smoking status: Never Smoker  . Smokeless tobacco: Never Used  Substance and Sexual Activity  . Alcohol use: Yes    Comment: occasional  . Drug use: No  . Sexual activity: Not on file  Lifestyle  . Physical activity:    Days per week: Not on file    Minutes per session: Not on file  . Stress: Not on file  Relationships  . Social connections:    Talks on phone: Not on file    Gets  together: Not on file    Attends religious service: Not on file    Active member of club or organization: Not on file    Attends meetings of clubs or organizations: Not on file    Relationship status: Not on file  . Intimate partner violence:    Fear of current or ex partner: Not on file    Emotionally abused: Not on file    Physically abused: Not on file    Forced sexual activity: Not on file  Other Topics Concern  . Not on file  Social History Narrative   Divorced ~2010   Step son and daughter and he has child from earlier relationship   Now living with woman since ~2017   She has grown children also   Review of Systems No headache--this is usually what tells him his BP is up Sleep is not great--some nocturia and never great sleeper    Objective:   Physical Exam  Constitutional: He appears well-developed. No distress.  Neck: No thyromegaly present.  Cardiovascular: Normal rate, regular rhythm and normal heart sounds. Exam reveals no gallop.  No murmur heard. Respiratory: Effort normal and breath sounds normal. No respiratory distress.  He has no wheezes. He has no rales.  Musculoskeletal:     Comments: Thick calves but no pitting  Lymphadenopathy:    He has no cervical adenopathy.  Psychiatric: He has a normal mood and affect. His behavior is normal.           Assessment & Plan:

## 2019-07-07 ENCOUNTER — Telehealth: Payer: Self-pay

## 2019-07-07 MED ORDER — COLCHICINE 0.6 MG PO TABS: 0.6000 mg | ORAL_TABLET | Freq: Two times a day (BID) | ORAL | 0 refills | Status: AC | PRN

## 2019-07-07 NOTE — Telephone Encounter (Signed)
GFR 60 Colchicine prescription sent in to use for flare.

## 2019-07-07 NOTE — Telephone Encounter (Signed)
Spoke to pt. I told him sometimes there areissues with the insurance covering colchicine versus a name brand colchicine. He will check with the pharmacy and let us know if there are any issues.

## 2019-07-07 NOTE — Telephone Encounter (Signed)
See below

## 2019-07-07 NOTE — Telephone Encounter (Signed)
Pt has not had a Gout flareup in several years. Mitchell Hardy he has had a flareup since 07-03-19 and has no colchicine. Asking if a provider would call something in for him in Dr Alla German absence.  [place]

## 2019-09-05 ENCOUNTER — Other Ambulatory Visit: Payer: Self-pay | Admitting: Internal Medicine

## 2020-03-20 ENCOUNTER — Other Ambulatory Visit: Payer: Self-pay | Admitting: Internal Medicine

## 2020-06-25 ENCOUNTER — Other Ambulatory Visit: Payer: Self-pay | Admitting: Internal Medicine

## 2024-07-04 ENCOUNTER — Encounter: Payer: Self-pay | Admitting: Adult Health

## 2024-07-04 ENCOUNTER — Ambulatory Visit: Admitting: Adult Health

## 2024-07-04 VITALS — BP 126/78 | HR 82 | Ht 69.0 in | Wt 239.8 lb

## 2024-07-04 DIAGNOSIS — G4733 Obstructive sleep apnea (adult) (pediatric): Secondary | ICD-10-CM | POA: Diagnosis not present

## 2024-07-04 DIAGNOSIS — Z6835 Body mass index (BMI) 35.0-35.9, adult: Secondary | ICD-10-CM

## 2024-07-04 DIAGNOSIS — R5383 Other fatigue: Secondary | ICD-10-CM

## 2024-07-04 DIAGNOSIS — G479 Sleep disorder, unspecified: Secondary | ICD-10-CM

## 2024-07-04 NOTE — Patient Instructions (Addendum)
 Begin CPAP At bedtime, wear all night long  Order for CPAP supplies, heated tubing, Resmed F30i full face mask, SD card, enroll in airview Work on healthy weight loss Do not drive if sleepy  Can discuss Zepbound on return visit CPAP download on return.  Follow up in 6 -8 weeks and As needed

## 2024-07-04 NOTE — Progress Notes (Signed)
 @Patient  ID: Mitchell Hardy, male    DOB: September 19, 1961, 63 y.o.   MRN: [id number]  Chief Complaint  Patient presents with   Consult    Sleep    Referring provider: Salman, Faiza, MD  HPI: 63 year old male seen for sleep consult July 04, 2024 for severe obstructive sleep apnea Gets medical care through the [facility] in [place] Architectural technologist)    TEST/EVENTS :  Discussed the use of AI scribe software for clinical note transcription with the patient, who gave verbal consent to proceed.  History of Present Illness Mitchell Hardy is a 63 year old male with sleep apnea who presents for a sleep consult. He was referred by the [facility] for a sleep consult after an eye doctor identified signs of sleep apnea during an eye examination.  He experienced a sensation of 'grit' in his eyes upon waking, prompting an eye examination. The eye doctor noted busted blood vessels in the back of his eyes.  Following the eye examination, he [facility], who referred him for a sleep study. The at-home sleep study, conducted over two and a half months, revealed seventy-four episodes of apnea per hour. He received a CPAP machine but faced delays in setup and instruction, leading him to seek outside assistance. He initially received the machine without a mask, which was later provided during this visit.  He is active in daily life, engaging in activities such as building, working on vehicles, and cutting wood, but experiences significant lack of energy, which he attributes to sleep apnea. He also experiences lightheadedness and dizziness as side effects from blood pressure medications, compounding his fatigue.  No diabetes. Family history includes both parents dying from cancer. He does not smoke or use alcohol or drugs. 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 He lives with a pug.     Allergies  Allergen Reactions   Atarax  [Hydroxyzine Hcl] Anaphylaxis and Rash    Immunization History  Administered Date(s) Administered   Td 02/20/2004   Tdap 09/21/2013    Past Medical History:  Diagnosis Date   Gout    Hypertension     Tobacco History: Social History   Tobacco Use  Smoking Status Never  Smokeless Tobacco Never   Counseling given: Not Answered   Outpatient Medications Prior to Visit  Medication Sig Dispense Refill   colchicine  0.6 MG tablet Take 1 tablet (0.6 mg total) by mouth 2 (two) times daily as needed (gout flare). 30 tablet 0   lisinopril-hydrochlorothiazide (ZESTORETIC) 20-12.5 MG tablet Take 1 tablet by mouth in the morning and at bedtime.     triamterene -hydrochlorothiazide (MAXZIDE-25) 37.5-25 MG tablet TAKE 1 TABLET BY MOUTH ONCE A DAY 30 tablet 0   No facility-administered medications prior to visit.     Review of Systems:   Constitutional:   No  weight loss, night sweats,  Fevers, chills, fatigue, or  lassitude.  HEENT:   No headaches,  Difficulty swallowing,  Tooth/dental problems, or  Sore throat,                No sneezing, itching, ear ache, nasal congestion, post nasal drip,   CV:  No chest pain,  Orthopnea, PND, swelling in lower extremities, anasarca, dizziness, palpitations, syncope.   GI  No heartburn, indigestion, abdominal pain, nausea, vomiting, diarrhea, change in bowel habits, loss of appetite, bloody stools.   Resp: No shortness of breath  with exertion or at rest.  No excess mucus, no productive cough,  No non-productive cough,  No coughing up of blood.  No change in color of mucus.  No wheezing.  No chest wall deformity  Skin: no rash or lesions.  GU: no dysuria, change in color of urine, no urgency or frequency.  No flank pain, no hematuria   MS:  No joint pain or swelling.  No decreased range of motion.  No back pain.    Physical Exam  BP 126/78   Pulse 82   Ht 5' 9 (1.753 m)   Wt 239 lb 12.8 oz (108.8 kg)   SpO2 98% Comment: RA  BMI 35.41  kg/m   GEN: A/Ox3; pleasant , NAD, well nourished    HEENT:  [place]/AT,  EACs-clear, TMs-wnl, NOSE-clear, THROAT-clear, no lesions, no postnasal drip or exudate noted.   NECK:  Supple w/ fair ROM; no JVD; normal carotid impulses w/o bruits; no thyromegaly or nodules palpated; no lymphadenopathy.    RESP  Clear  P & A; w/o, wheezes/ rales/ or rhonchi. no accessory muscle use, no dullness to percussion  CARD:  RRR, no m/r/g, no peripheral edema, pulses intact, no cyanosis or clubbing.  GI:   Soft & nt; nml bowel sounds; no organomegaly or masses detected.   Musco: Warm bil, no deformities or joint swelling noted.   Neuro: alert, no focal deficits noted.    Skin: Warm, no lesions or rashes    Lab Results:  CBC    Component Value Date/Time   WBC 3.7 (L) 10/26/2018 1220   RBC 5.50 10/26/2018 1220   HGB 17.1 (H) 10/26/2018 1220   HCT 49.3 10/26/2018 1220   PLT 155.0 10/26/2018 1220   MCV 89.6 10/26/2018 1220   MCHC 34.7 10/26/2018 1220   RDW 13.7 10/26/2018 1220   LYMPHSABS 1.8 02/06/2011 1039   MONOABS 0.5 02/06/2011 1039   EOSABS 0.2 02/06/2011 1039   BASOSABS 0.0 02/06/2011 1039    BMET    Component Value Date/Time   NA 140 12/09/2018 0743   K 4.4 12/09/2018 0743   CL 102 12/09/2018 0743   CO2 30 12/09/2018 0743   GLUCOSE 129 (H) 12/09/2018 0743   BUN 17 12/09/2018 0743   CREATININE 1.23 12/09/2018 0743   CALCIUM 9.1 12/09/2018 0743    BNP No results found for: BNP  ProBNP No results found for: PROBNP  Imaging: No results found.  Administration History     None           No data to display          No results found for: NITRICOXIDE      Assessment & Plan:   Assessment and Plan Assessment & Plan Obstructive sleep apnea   He has severe obstructive sleep apnea with 74 episodes per hour, causing hypoxemia and potential complications like cardiovascular issues, memory problems, and ocular damage. CPAP therapy is essential to prevent  airway collapse and improve oxygenation during sleep, aiming to reduce episodes to below 5 per hour. This significantly improves symptoms and reduces health risks, including stroke, heart disease, congestive heart failure, diabetes, and macular degeneration. Initiate CPAP therapy at bedtime for use throughout the night. Order CPAP supplies, including heated tubing and a ResMed F30i full face mask. Instruct on CPAP machine setup and maintenance, with daily cleaning of the water reservoir and weekly cleaning of the tubing. Enroll in the Airview system for monitoring usage and pressures. Schedule a follow-up in 6-8 weeks  to assess CPAP effectiveness and make necessary adjustments.  Fatigue   Chronic fatigue is likely related to severe obstructive sleep apnea. Improvement is expected with effective CPAP therapy.  Obesity   With a BMI of 35, he is classified as obese. Weight loss is recommended to potentially improve sleep apnea and overall health. Discussed the potential use of GLP-1 injectable Zepbound for weight loss, though insurance coverage may be a barrier. Weight loss may not completely resolve sleep apnea but can improve it. Implement healthy weight loss strategies and discuss Zepbound for weight loss at follow-up if interested.  Hypertension   Hypertension management is complicated by side effects from lisinopril and HCTZ, including lightheadedness and dizziness. Improvement in sleep apnea may positively impact blood pressure control.       I spent 45   minutes dedicated to the care of this patient on the date of this encounter to include pre-visit review of records, face-to-face time with the patient discussing conditions above, post visit ordering of testing, clinical documentation with the electronic health record, making appropriate referrals as documented, and communicating necessary findings to members of the patients care team.   Madelin Stank, NP 07/04/2024

## 2024-08-16 ENCOUNTER — Telehealth: Payer: Self-pay

## 2024-08-16 NOTE — Telephone Encounter (Signed)
 ATC LVMTCB Asking pt if he has received his CPAP machine yet.

## 2024-08-21 ENCOUNTER — Encounter: Payer: Self-pay | Admitting: Adult Health

## 2024-08-21 ENCOUNTER — Ambulatory Visit: Admitting: Adult Health

## 2024-08-21 VITALS — BP 154/90 | HR 67 | Temp 99.2°F | Ht 68.5 in | Wt 237.4 lb

## 2024-08-21 DIAGNOSIS — G4733 Obstructive sleep apnea (adult) (pediatric): Secondary | ICD-10-CM | POA: Diagnosis not present

## 2024-08-21 NOTE — Progress Notes (Signed)
 @Patient  ID: Mitchell Hardy, male    DOB: 05-Feb-1961, 63 y.o.   MRN: [id number]  Chief Complaint  Patient presents with   Obstructive Sleep Apnea    F/u    Referring provider: Jimmy Charlie FERNS, MD  HPI: 63 year old male seen for sleep consult July 04, 2024 for severe obstructive sleep apnea Gets medical care through the [facility] in [place] (Dr. Ellouise) , retired [employer] veteran    TEST/EVENTS : Reviewed 08/21/2024  Discussed the use of AI scribe software for clinical note transcription with the patient, who gave verbal consent to proceed.  History of Present Illness Mitchell Hardy is a 63 year old male who presents for follow-up regarding his CPAP therapy for sleep apnea. He was referred by Dr. Salman at [facility] for coordination of CPAP supplies and management.  He has experienced significant improvement in symptoms since starting CPAP therapy, noting a substantial increase in energy levels and decreased daytime sleepiness. He uses the CPAP machine consistently for seven hours nightly.  He began using the CPAP machine in October. Initially, he encountered issues with the mask seal due to his tendency to toss and turn during sleep, but he has since found a comfortable position that maintains the seal. Although the mask is not his favorite, he appreciates the results it provides.  Currently using ResMed F30 I fullface mask.  Needs supplies sent to the [facility] system.  Additional benefits from CPAP therapy include a reduction in 'brain fog' and the resolution of morning sinusitis symptoms. He no longer wakes up with sinus congestion or the need to clear his throat.  He is currently dealing with significant stress due to the recent loss of a close friend and the declining health of his 73 year old pug. Despite this, he feels he is managing well.  He plans to join a local [employer] to increase his physical activity, aiming to lose twenty pounds in six months. He has been advised to  increase his intake of fish and walnuts to improve his cholesterol levels.  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  CPAP download -patient has Airview app that shows excellent 100% compliance with daily average usage at 7 hours.  AHI average 13/hour.  Currently on CPAP AutoSet 5 to 20 cm H2O.  Allergies  Allergen Reactions   Atarax [Hydroxyzine Hcl] Anaphylaxis and Rash    Immunization History  Administered Date(s) Administered   Td 02/20/2004   Tdap 09/21/2013    Past Medical History:  Diagnosis Date   Gout    Hypertension     Tobacco History: Social History   Tobacco Use  Smoking Status Never  Smokeless Tobacco Never   Counseling given: Not Answered   Outpatient Medications Prior to Visit  Medication Sig Dispense Refill   colchicine  0.6 MG tablet Take 1 tablet (0.6 mg total) by mouth 2 (two) times daily as needed (gout flare). 30 tablet 0   lisinopril-hydrochlorothiazide (ZESTORETIC) 20-12.5 MG tablet Take 1 tablet by mouth daily. for high blood pressure     lisinopril-hydrochlorothiazide (ZESTORETIC) 20-12.5 MG tablet Take 1 tablet by mouth in the morning and at bedtime.     triamterene -hydrochlorothiazide (MAXZIDE-25) 37.5-25 MG tablet TAKE 1 TABLET BY MOUTH ONCE A DAY (Patient not taking: Reported on 08/21/2024) 30 tablet 0   No facility-administered medications prior to visit.     Review of Systems:   Constitutional:   No  weight loss, night sweats,  Fevers, chills, fatigue, or  lassitude.  HEENT:   No headaches,  Difficulty swallowing,  Tooth/dental problems, or  Sore throat,                No sneezing, itching, ear ache, nasal congestion, post nasal drip,   CV:  No chest pain,  Orthopnea, PND, swelling in lower extremities, anasarca, dizziness, palpitations, syncope.   GI  No heartburn, indigestion, abdominal pain, nausea, vomiting, diarrhea, change in bowel habits, loss of appetite, bloody stools.   Resp: No shortness of breath with exertion  or at rest.  No excess mucus, no productive cough,  No non-productive cough,  No coughing up of blood.  No change in color of mucus.  No wheezing.  No chest wall deformity  Skin: no rash or lesions.  GU: no dysuria, change in color of urine, no urgency or frequency.  No flank pain, no hematuria   MS:  No joint pain or swelling.  No decreased range of motion.  No back pain.    Physical Exam  BP (!) 147/80   Pulse 67   Temp 99.2 F (37.3 C)   Ht 5' 8.5 (1.74 m) Comment: Per pt  Wt 237 lb 6.4 oz (107.7 kg)   SpO2 98% Comment: RA  BMI 35.57 kg/m   GEN: A/Ox3; pleasant , NAD, well nourished    HEENT:  [place]/AT, NOSE-clear, THROAT-clear, no lesions, no postnasal drip or exudate noted.   NECK:  Supple w/ fair ROM; no JVD; normal carotid impulses w/o bruits; no thyromegaly or nodules palpated; no lymphadenopathy.    RESP  Clear  P & A; w/o, wheezes/ rales/ or rhonchi. no accessory muscle use, no dullness to percussion  CARD:  RRR, no m/r/g, no peripheral edema, pulses intact, no cyanosis or clubbing.  GI:   Soft & nt; nml bowel sounds; no organomegaly or masses detected.   Musco: Warm bil, no deformities or joint swelling noted.   Neuro: alert, no focal deficits noted.    Skin: Warm, no lesions or rashes    Lab Results:Reviewed 08/21/2024   CBC   BMET  BNP No results found for: BNP  ProBNP No results found for: PROBNP  Imaging: No results found.  Administration History     None           No data to display          No results found for: NITRICOXIDE     07/04/2024   12:00 PM  Results of the [place] flowsheet  Sitting and reading 3  Watching TV 2  Sitting, inactive in a public place (e.g. a theatre or a meeting) 2  As a passenger in a car for an hour without a break 2  Lying down to rest in the afternoon when circumstances permit 3  Sitting and talking to someone 0  Sitting quietly after a lunch without alcohol 2  In a car, while stopped  for a few minutes in traffic 0  Total score 14        Assessment & Plan:   Assessment and Plan Assessment & Plan Obstructive sleep apnea  -Severe -improved control on CPAP.  CPAP therapy has significantly improved his symptoms, reducing initial episodes from 70 /hr to 13 per hour.  He reports improved sleep quality, reduced brain fog, and resolution of morning sinusitis symptoms.  He feels better and more rested, indicating effective therapy. CPAP pressure settings have been adjusted to further reduce episodes. Change to 10-20cmh2o. [facility]  [facility]. Ordered new CPAP supplies, including a mask and hose, and ensured machine settings are adjusted electronically through the [facility]. Educated on maintaining CPAP equipment hygiene with distilled water and regular cleaning.   Overweight   He aims to lose 20 pounds in six months. Plans to join a local fitness center and increase physical activity. Discussed the potential use of Zepbound if lifestyle modifications are insufficient. Emphasized the importance of diet, exercise, and good sleep in weight management.   Plan  Patient Instructions  Continue on CPAP At bedtime, wear all night long  Order for CPAP supplies, heated tubing, Resmed F30i full face mask, SD card, enroll in airview-[facility] [place] -Dr. Ellouise  Order for CPAP pressure change -Auto CPAP 10-20cmH2o.  Work on healthy weight loss Do not drive if sleepy  Follow up in 3-4 months and As needed         Madelin Stank, NP 08/21/2024

## 2024-08-21 NOTE — Patient Instructions (Addendum)
 Continue on CPAP At bedtime, wear all night long  Order for CPAP supplies, heated tubing, Resmed F30i full face mask, SD card, enroll in airview-[facility] [place] -Dr. Ellouise  Order for CPAP pressure change -Auto CPAP 10-20cmH2o.  Work on healthy weight loss Do not drive if sleepy  Follow up in 3-4 months and As needed

## 2024-12-11 ENCOUNTER — Ambulatory Visit: Admitting: Adult Health
